# Patient Record
Sex: Female | Born: 2000 | Race: Black or African American | Hispanic: No | Marital: Single
Health system: Southern US, Community
[De-identification: ages and names within clinical notes are randomized; demographics above are authoritative.]

---
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
## 2021-01-11 ENCOUNTER — Ambulatory Visit (INDEPENDENT_AMBULATORY_CARE_PROVIDER_SITE_OTHER): Payer: Medicaid Other

## 2021-01-11 ENCOUNTER — Other Ambulatory Visit: Payer: Self-pay

## 2021-01-11 DIAGNOSIS — Z3042 Encounter for surveillance of injectable contraceptive: Secondary | ICD-10-CM

## 2021-01-11 MED ORDER — MEDROXYPROGESTERONE ACETATE 150 MG/ML IM SUSP
150.0000 mg | Freq: Once | INTRAMUSCULAR | Status: AC
  Administered 2021-01-11: 150 mg via INTRAMUSCULAR

## 2021-01-11 NOTE — Progress Notes (Signed)
Pt presents for depo injection. Pt within depo window, no urine hcg needed. Injection given, tolerated well. F/u depo injection visit scheduled.   

## 2021-03-29 ENCOUNTER — Ambulatory Visit: Payer: Medicaid Other

## 2021-04-11 ENCOUNTER — Ambulatory Visit (INDEPENDENT_AMBULATORY_CARE_PROVIDER_SITE_OTHER): Payer: Medicaid Other

## 2021-04-11 ENCOUNTER — Other Ambulatory Visit: Payer: Self-pay

## 2021-04-11 DIAGNOSIS — Z3042 Encounter for surveillance of injectable contraceptive: Secondary | ICD-10-CM

## 2021-04-11 MED ORDER — MEDROXYPROGESTERONE ACETATE 150 MG/ML IM SUSP
150.0000 mg | Freq: Once | INTRAMUSCULAR | Status: AC
Start: 2021-04-11 — End: 2021-04-11
  Administered 2021-04-11: 150 mg via INTRAMUSCULAR

## 2021-04-11 NOTE — Progress Notes (Signed)
Pt presents for depo injection. Pt within depo window, no urine hcg needed. Injection given, tolerated well. F/u depo injection visit scheduled.   

## 2021-06-27 ENCOUNTER — Ambulatory Visit: Payer: Medicaid Other

## 2021-07-07 ENCOUNTER — Ambulatory Visit (INDEPENDENT_AMBULATORY_CARE_PROVIDER_SITE_OTHER): Payer: Medicaid Other

## 2021-07-07 DIAGNOSIS — Z3042 Encounter for surveillance of injectable contraceptive: Secondary | ICD-10-CM

## 2021-07-07 MED ORDER — MEDROXYPROGESTERONE ACETATE 150 MG/ML IM SUSP
150.0000 mg | Freq: Once | INTRAMUSCULAR | Status: AC
Start: 2021-07-07 — End: 2021-07-07
  Administered 2021-07-07: 150 mg via INTRAMUSCULAR

## 2021-07-07 NOTE — Progress Notes (Addendum)
Pt presents for depo injection. Pt within depo window, no urine hcg needed. Injection given, tolerated well. F/u depo injection visit scheduled.  ? ?Wt Readings from Last 3 Encounters:  ?04/29/19 105 lb 9.6 oz (47.9 kg) (15 %, Z= -1.02)*  ?10/17/18 104 lb 9.6 oz (47.4 kg) (16 %, Z= -0.99)*  ?05/14/13 86 lb 3.2 oz (39.1 kg) (55 %, Z= 0.13)*  ? ?* Growth percentiles are based on CDC (Girls, 2-20 Years) data.  ?  ? ?Needs appt with provider at next Depo. Reviewed and agree with plan of care.  ?

## 2021-07-09 NOTE — Addendum Note (Signed)
Addended by: Georges Mouse on: 07/09/2021 12:58 PM ? ? Modules accepted: Level of Service ? ?

## 2021-08-23 ENCOUNTER — Ambulatory Visit: Payer: Medicaid Other | Admitting: Family Medicine

## 2021-08-23 DIAGNOSIS — Z681 Body mass index (BMI) 19 or less, adult: Secondary | ICD-10-CM | POA: Diagnosis not present

## 2021-08-23 DIAGNOSIS — E559 Vitamin D deficiency, unspecified: Secondary | ICD-10-CM | POA: Diagnosis not present

## 2021-08-23 DIAGNOSIS — D649 Anemia, unspecified: Secondary | ICD-10-CM | POA: Diagnosis not present

## 2021-08-23 DIAGNOSIS — Z1159 Encounter for screening for other viral diseases: Secondary | ICD-10-CM | POA: Diagnosis not present

## 2021-08-23 DIAGNOSIS — Z0000 Encounter for general adult medical examination without abnormal findings: Secondary | ICD-10-CM | POA: Diagnosis not present

## 2021-08-23 DIAGNOSIS — Z0130 Encounter for examination of blood pressure without abnormal findings: Secondary | ICD-10-CM | POA: Diagnosis not present

## 2021-09-22 ENCOUNTER — Ambulatory Visit: Payer: Medicaid Other | Admitting: Pediatrics

## 2021-10-03 ENCOUNTER — Encounter: Payer: Self-pay | Admitting: Family

## 2021-10-03 ENCOUNTER — Ambulatory Visit (INDEPENDENT_AMBULATORY_CARE_PROVIDER_SITE_OTHER): Payer: Medicaid Other | Admitting: Family

## 2021-10-03 VITALS — BP 117/81 | HR 83 | Ht 66.14 in | Wt 123.6 lb

## 2021-10-03 DIAGNOSIS — N921 Excessive and frequent menstruation with irregular cycle: Secondary | ICD-10-CM

## 2021-10-03 DIAGNOSIS — Z113 Encounter for screening for infections with a predominantly sexual mode of transmission: Secondary | ICD-10-CM | POA: Diagnosis not present

## 2021-10-03 DIAGNOSIS — Z3042 Encounter for surveillance of injectable contraceptive: Secondary | ICD-10-CM

## 2021-10-03 MED ORDER — WOMENS MULTIVITAMIN PO TABS: ORAL_TABLET | ORAL | 0 refills | Status: AC

## 2021-10-03 MED ORDER — MEDROXYPROGESTERONE ACETATE 150 MG/ML IM SUSP
150.0000 mg | Freq: Once | INTRAMUSCULAR | Status: AC
  Administered 2021-10-03: 150 mg via INTRAMUSCULAR

## 2021-10-03 MED ORDER — VITAMIN D 50 MCG (2000 UT) PO CAPS: ORAL_CAPSULE | ORAL | 0 refills | Status: AC

## 2021-10-03 NOTE — Progress Notes (Signed)
Pt presents for depo injection. Pt within depo window, no urine hcg needed. Injection given, tolerated well. F/u depo injection visit scheduled.   

## 2021-10-03 NOTE — Progress Notes (Signed)
History was provided by the patient.  Stephanie Montoya is a 21 y.o. female who is here for breakthrough bleeding on Depo.   PCP confirmed? No.  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 (Patient not taking: Reported on 10/03/2021) 17 g 0   Spacer/Aero-Holding Chambers (AEROCHAMBER PLUS WITH MASK) inhaler One spacer 1 each 1   No current facility-administered medications on file prior to visit.    No Known Allergies  Physical Exam:    Vitals:   10/03/21 0843  BP: 117/81  Pulse: 83  Weight: 123 lb 9.6 oz (56.1 kg)  Height: 5' 6.14" (1.68 m)    Blood pressure %iles are not available for patients who are 18 years or older. No LMP recorded.  Physical Exam Constitutional:      General: She is not in acute distress.    Appearance: She is well-developed.  HENT:     Head: Normocephalic and atraumatic.  Eyes:     General: No scleral icterus.    Pupils: Pupils are equal, round, and reactive to light.  Neck:     Thyroid: No thyromegaly.  Cardiovascular:     Rate and Rhythm: Normal rate and regular rhythm.     Heart sounds: Normal heart sounds. No murmur heard. Pulmonary:     Effort: Pulmonary effort is normal.     Breath sounds: Normal breath sounds.  Musculoskeletal:        General: Normal range of motion.     Cervical back: Normal range of motion and neck supple.  Lymphadenopathy:     Cervical: No cervical adenopathy.   Skin:    General: Skin is warm and dry.     Findings: No rash.  Neurological:     Mental Status: She is alert and oriented to person, place, and time.     Cranial Nerves: No cranial nerve deficit.  Psychiatric:        Behavior: Behavior normal.        Thought Content: Thought content normal.        Judgment: Judgment normal.     Assessment/Plan: 1. Menorrhagia with irregular cycle 2. Encounter for Depo-Provera contraception - medroxyPROGESTERone (DEPO-PROVERA) injection 150 mg -due for DXA to assess bone density due to prolonged depo use (> 2 years)  -recommended DMV, vitamin D and calcium supplement  3. Routine screening for STI (sexually transmitted infection) - C. trachomatis/N. gonorrhoeae RNA

## 2021-10-04 LAB — C. TRACHOMATIS/N. GONORRHOEAE RNA
C. trachomatis RNA, TMA: NOT DETECTED
N. gonorrhoeae RNA, TMA: NOT DETECTED

## 2021-11-03 ENCOUNTER — Other Ambulatory Visit: Payer: Self-pay

## 2021-11-03 NOTE — Telephone Encounter (Signed)
AccessNurse Call [phone].   Mom calls requesting a refill for Cholcalciferol.   We do not have a DPR on file to speak with mom. Will use MyChart to communicate with patient.

## 2021-12-01 DIAGNOSIS — Z0130 Encounter for examination of blood pressure without abnormal findings: Secondary | ICD-10-CM | POA: Diagnosis not present

## 2021-12-01 DIAGNOSIS — R404 Transient alteration of awareness: Secondary | ICD-10-CM | POA: Diagnosis not present

## 2021-12-01 DIAGNOSIS — Z681 Body mass index (BMI) 19 or less, adult: Secondary | ICD-10-CM | POA: Diagnosis not present

## 2021-12-01 DIAGNOSIS — E559 Vitamin D deficiency, unspecified: Secondary | ICD-10-CM | POA: Diagnosis not present

## 2021-12-19 ENCOUNTER — Ambulatory Visit: Payer: Medicaid Other | Admitting: Family

## 2022-01-26 ENCOUNTER — Ambulatory Visit: Payer: Medicaid Other | Admitting: Family

## 2022-02-20 ENCOUNTER — Ambulatory Visit (INDEPENDENT_AMBULATORY_CARE_PROVIDER_SITE_OTHER): Payer: Medicaid Other | Admitting: Family

## 2022-02-20 ENCOUNTER — Encounter: Payer: Self-pay | Admitting: Family

## 2022-02-20 VITALS — BP 110/77 | HR 87 | Ht 65.75 in | Wt 124.0 lb

## 2022-02-20 DIAGNOSIS — Z30017 Encounter for initial prescription of implantable subdermal contraceptive: Secondary | ICD-10-CM

## 2022-02-20 DIAGNOSIS — Z113 Encounter for screening for infections with a predominantly sexual mode of transmission: Secondary | ICD-10-CM

## 2022-02-20 DIAGNOSIS — Z3202 Encounter for pregnancy test, result negative: Secondary | ICD-10-CM | POA: Diagnosis not present

## 2022-02-20 DIAGNOSIS — N921 Excessive and frequent menstruation with irregular cycle: Secondary | ICD-10-CM

## 2022-02-20 MED ORDER — ETONOGESTREL 68 MG ~~LOC~~ IMPL
68.0000 mg | DRUG_IMPLANT | Freq: Once | SUBCUTANEOUS | Status: AC
  Administered 2022-02-20: 68 mg via SUBCUTANEOUS

## 2022-02-20 NOTE — Progress Notes (Signed)
History was provided by the patient.  Stephanie Montoya is a 21 y.o. female who is here for nexplanon insertion.   PCP confirmed? No  HPI:   -stopped taking Lexapro 20; was getting more mood swings (Christian Grow)  -LMP: yesterday  -no cramping, no dysuria, no pain with intercourse   Patient Active Problem List   Diagnosis Date Noted   Dysmenorrhea in adolescent 04/23/2019   Menorrhagia with irregular cycle 04/23/2019   Asthma, chronic 05/14/2013    Current Outpatient Medications on File Prior to Visit  Medication Sig Dispense Refill   escitalopram (LEXAPRO) 20 MG tablet Take 20 mg by mouth daily.     albuterol (VENTOLIN HFA) 108 (90 Base) MCG/ACT inhaler Inhale 2 puffs into the lungs every 4 (four) hours as needed for wheezing or shortness of breath. (Patient not taking: Reported on 10/03/2021) 17 g 0   Cholecalciferol (VITAMIN D) 50 MCG (2000 UT) CAPS Take one 50 mcg capsule daily by mouth. (Patient not taking: Reported on 02/20/2022) 90 capsule 0   Multiple Vitamins-Minerals (WOMENS MULTIVITAMIN) TABS Take one tablet by mouth once daily. (Patient not taking: Reported on 02/20/2022) 90 tablet 0   Spacer/Aero-Holding Chambers (AEROCHAMBER PLUS WITH MASK) inhaler One spacer (Patient not taking: Reported on 02/20/2022) 1 each 1   No current facility-administered medications on file prior to visit.    No Known Allergies  Physical Exam:    Vitals:   02/20/22 1027  BP: 110/77  Pulse: 87  Weight: 124 lb (56.2 kg)  Height: 5' 5.75" (1.67 m)    Blood pressure %iles are not available for patients who are 18 years or older. No LMP recorded.  Physical Exam Constitutional:      General: She is not in acute distress.    Appearance: She is well-developed.  HENT:     Head: Normocephalic and atraumatic.  Eyes:     General: No scleral icterus.    Pupils: Pupils are equal, round, and reactive to light.  Neck:     Thyroid: No thyromegaly.  Cardiovascular:     Rate and Rhythm: Normal rate  and regular rhythm.     Heart sounds: Normal heart sounds. No murmur heard. Pulmonary:     Effort: Pulmonary effort is normal.     Breath sounds: Normal breath sounds.  Musculoskeletal:        General: Normal range of motion.     Cervical back: Normal range of motion and neck supple.  Lymphadenopathy:     Cervical: No cervical adenopathy.  Skin:    General: Skin is warm and dry.     Findings: No rash.  Neurological:     Mental Status: She is alert and oriented to person, place, and time.     Cranial Nerves: No cranial nerve deficit.     Motor: No tremor.  Psychiatric:        Behavior: Behavior normal.        Thought Content: Thought content normal.        Judgment: Judgment normal.      Assessment/Plan:  1. Menorrhagia with irregular cycle 2. Encounter for initial prescription of Nexplanon -see procedure note, patient tolerated well; plan for one month follow-up or sooner if needed  -advised to abstain x 7 days before it is effective for birth control  - Subdermal Etonogestrel Implant Insertion - etonogestrel (NEXPLANON) implant 68 mg  3. Routine screening for STI (sexually transmitted infection) - C. trachomatis/N. gonorrhoeae RNA  4. Pregnancy examination or test, negative  result - POCT urine pregnancy

## 2022-02-20 NOTE — Procedures (Signed)
Nexplanon Insertion  No contraindications for placement.  No liver disease, no unexplained vaginal bleeding, no h/o breast cancer, no h/o blood clots.  No LMP recorded.  UHCG: negative   Last Unprotected sex:  NA  Risks & benefits of Nexplanon discussed The nexplanon device was purchased and supplied by [facility]. Packaging instructions supplied to patient Consent form signed  The patient denies any allergies to anesthetics or antiseptics.  Procedure: Pt was placed in supine position. The right arm was flexed at the elbow and externally rotated so that right wrist was parallel to right ear The medial epicondyle of the right arm was identified The insertions site was marked 8 cm proximal to the medial epicondyle The insertion site was cleaned with Betadine The area surrounding the insertion site was covered with a sterile drape 1% lidocaine was injected just under the skin at the insertion site extending 4 cm proximally. The sterile preloaded disposable Nexaplanon applicator was removed from the sterile packaging The applicator needle was inserted at a 30 degree angle at 8 cm proximal to the medial epicondyle as marked The applicator was lowered to a horizontal position and advanced just under the skin for the full length of the needle The slider on the applicator was retracted fully while the applicator remained in the same position, then the applicator was removed. The implant was confirmed via palpation as being in position The implant position was demonstrated to the patient Pressure dressing was applied to the patient.  The patient was instructed to removed the pressure dressing in 24 hrs.  The patient was advised to move slowly from a supine to an upright position  The patient denied any concerns or complaints  The patient was instructed to schedule a follow-up appt in 1 month and to call sooner if any concerns.  The patient acknowledged agreement and understanding of the  plan.

## 2022-02-20 NOTE — Patient Instructions (Signed)
Follow-up with Stephanie Montoya in 1 month. Schedule this appointment before you leave clinic today.  Congratulations on getting your Nexplanon placement!  Below is some important information about Nexplanon.  First remember that Nexplanon does not prevent sexually transmitted infections.  Condoms will help prevent sexually transmitted infections. The Nexplanon starts working 7 days after it was inserted.  There is a risk of getting pregnant if you have unprotected sex in those first 7 days after placement of the Nexplanon.  The Nexplanon lasts for 3 years but can be removed at any time.  You can become pregnant as early as 1 week after removal.  You can have a new Nexplanon put in after the old one is removed if you like.  It is not known whether Nexplanon is as effective in women who are very overweight because the studies did not include many overweight women.  Nexplanon interacts with some medications, including barbiturates, bosentan, carbamazepine, felbamate, griseofulvin, oxcarbazepine, phenytoin, rifampin, St. John's wort, topiramate, HIV medicines.  Please alert your doctor if you are on any of these medicines.  Always tell other healthcare providers that you have a Nexplanon in your arm.  The Nexplanon was placed just under the skin.  Leave the outside bandage on for 24 hours.  Leave the smaller bandage on for 3-5 days or until it falls off on its own.  Keep the area clean and dry for 3-5 days. There is usually bruising or swelling at the insertion site for a few days to a week after placement.  If you see redness or pus draining from the insertion site, call us immediately.  Keep your user card with the date the implant was placed and the date the implant is to be removed.  The most common side effect is a change in your menstrual bleeding pattern.   This bleeding is generally not harmful to you but can be annoying.  Call or come in to see us if you have any concerns about the bleeding or if  you have any side effects or questions.    We will call you in 1 week to check in and we would like you to return to the clinic for a follow-up visit in 1 month.  You can call [facility] 24 hours a day with any questions or concerns.  There is always a nurse or doctor available to take your call.  Call 9-1-1 if you have a life-threatening emergency.  For anything else, please call us at [phone] before heading to the ER. 

## 2022-02-21 LAB — C. TRACHOMATIS/N. GONORRHOEAE RNA
C. trachomatis RNA, TMA: NOT DETECTED
N. gonorrhoeae RNA, TMA: NOT DETECTED

## 2022-03-29 ENCOUNTER — Telehealth: Payer: Medicaid Other | Admitting: Family

## 2022-03-29 ENCOUNTER — Encounter: Payer: Self-pay | Admitting: Family

## 2022-04-06 ENCOUNTER — Telehealth (INDEPENDENT_AMBULATORY_CARE_PROVIDER_SITE_OTHER): Payer: Medicaid Other | Admitting: Family

## 2022-04-06 ENCOUNTER — Encounter: Payer: Self-pay | Admitting: Family

## 2022-04-06 DIAGNOSIS — N921 Excessive and frequent menstruation with irregular cycle: Secondary | ICD-10-CM

## 2022-04-06 DIAGNOSIS — Z975 Presence of (intrauterine) contraceptive device: Secondary | ICD-10-CM | POA: Insufficient documentation

## 2022-04-06 NOTE — Progress Notes (Signed)
THIS RECORD Pinon CONTAIN CONFIDENTIAL INFORMATION THAT SHOULD NOT BE RELEASED WITHOUT REVIEW OF THE SERVICE PROVIDER.  Virtual Follow-Up Visit via Video Note  I connected with Stephanie Montoya  on 04/06/22 at  2:30 PM EST by a video enabled telemedicine application and verified that I am speaking with the correct person using two identifiers.   Patient/parent location: dorm room [employer] Provider location: remote [place]    I discussed the limitations of evaluation and management by telemedicine and the availability of in person appointments.  I discussed that the purpose of this telehealth visit is to provide medical care while limiting exposure to the novel coronavirus.  The patient expressed understanding and agreed to proceed.   Stephanie Montoya is a 22 y.o. female referred by No ref. provider found here today for follow-up of nexplanon insertion.     History was provided by the patient.  Supervising Physician: Dr. Roselind Montoya   Plan from Last Visit:   Nexplanon insertion 02/20/22  Chief Complaint: Nexplanon in place   History of Present Illness:  -no bleeding or cramping  -site well-healed; wanted to know when it would have to be replaced  -mood is good, no concerns  -semester just started - lots of tests already assigned   No Known Allergies Outpatient Medications Prior to Visit  Medication Sig Dispense Refill   albuterol (VENTOLIN HFA) 108 (90 Base) MCG/ACT inhaler Inhale 2 puffs into the lungs every 4 (four) hours as needed for wheezing or shortness of breath. (Patient not taking: Reported on 10/03/2021) 17 g 0   Cholecalciferol (VITAMIN D) 50 MCG (2000 UT) CAPS Take one 50 mcg capsule daily by mouth. (Patient not taking: Reported on 02/20/2022) 90 capsule 0   escitalopram (LEXAPRO) 20 MG tablet Take 20 mg by mouth daily.     Multiple Vitamins-Minerals (WOMENS MULTIVITAMIN) TABS Take one tablet by mouth once daily. (Patient not taking: Reported on 02/20/2022) 90 tablet 0    Spacer/Aero-Holding Chambers (AEROCHAMBER PLUS WITH MASK) inhaler One spacer (Patient not taking: Reported on 02/20/2022) 1 each 1   No facility-administered medications prior to visit.     Patient Active Problem List   Diagnosis Date Noted   Dysmenorrhea in adolescent 04/23/2019   Menorrhagia with irregular cycle 04/23/2019   Asthma, chronic 05/14/2013   The following portions of the patient's history were reviewed and updated as appropriate: allergies, current medications, past family history, past medical history, past social history, past surgical history, and problem list.  Visual Observations/Objective:   General Appearance: Well nourished well developed, in no apparent distress.  Eyes: conjunctiva no swelling or erythema ENT/Mouth: No hoarseness, No cough for duration of visit.  Neck: Supple  Respiratory: Respiratory effort normal, normal rate, no retractions or distress.   Cardio: Appears well-perfused, noncyanotic Musculoskeletal: no obvious deformity Skin: visible skin without rashes, ecchymosis, erythema; well-healed RUE  Neuro: Awake and oriented X 3,  Psych:  normal affect, Insight and Judgment appropriate.    Assessment/Plan: 1. Nexplanon in place 2. Menorrhagia with irregular cycle -reviewed return precautions; advised implant is good for up to 5 years  -return as needed    I discussed the assessment and treatment plan with the patient and/or parent/guardian.  They were provided an opportunity to ask questions and all were answered.  They agreed with the plan and demonstrated an understanding of the instructions. They were advised to call back or seek an in-person evaluation in the emergency room if the symptoms worsen or if the condition fails to improve  as anticipated.   Follow-up:   PRN   Stephanie Ames, NP    CC: No primary care provider on file., No ref. provider found

## 2022-08-15 DIAGNOSIS — Z0000 Encounter for general adult medical examination without abnormal findings: Secondary | ICD-10-CM | POA: Diagnosis not present

## 2022-08-15 DIAGNOSIS — N63 Unspecified lump in unspecified breast: Secondary | ICD-10-CM | POA: Diagnosis not present

## 2022-08-15 DIAGNOSIS — Z0130 Encounter for examination of blood pressure without abnormal findings: Secondary | ICD-10-CM | POA: Diagnosis not present

## 2022-08-15 DIAGNOSIS — F419 Anxiety disorder, unspecified: Secondary | ICD-10-CM | POA: Diagnosis not present

## 2022-08-15 DIAGNOSIS — F32A Depression, unspecified: Secondary | ICD-10-CM | POA: Diagnosis not present

## 2022-08-15 DIAGNOSIS — R42 Dizziness and giddiness: Secondary | ICD-10-CM | POA: Diagnosis not present

## 2022-08-15 DIAGNOSIS — Z3200 Encounter for pregnancy test, result unknown: Secondary | ICD-10-CM | POA: Diagnosis not present

## 2022-08-15 DIAGNOSIS — S90229A Contusion of unspecified lesser toe(s) with damage to nail, initial encounter: Secondary | ICD-10-CM | POA: Diagnosis not present

## 2022-08-15 DIAGNOSIS — Z6820 Body mass index (BMI) 20.0-20.9, adult: Secondary | ICD-10-CM | POA: Diagnosis not present

## 2022-08-29 DIAGNOSIS — Z820 Family history of epilepsy and other diseases of the nervous system: Secondary | ICD-10-CM | POA: Diagnosis not present

## 2022-08-29 DIAGNOSIS — Z789 Other specified health status: Secondary | ICD-10-CM | POA: Diagnosis not present

## 2022-08-29 DIAGNOSIS — Z6821 Body mass index (BMI) 21.0-21.9, adult: Secondary | ICD-10-CM | POA: Diagnosis not present

## 2022-08-29 DIAGNOSIS — E038 Other specified hypothyroidism: Secondary | ICD-10-CM | POA: Diagnosis not present

## 2022-08-29 DIAGNOSIS — Z3200 Encounter for pregnancy test, result unknown: Secondary | ICD-10-CM | POA: Diagnosis not present

## 2022-08-29 DIAGNOSIS — F32A Depression, unspecified: Secondary | ICD-10-CM | POA: Diagnosis not present

## 2022-08-29 DIAGNOSIS — Z0130 Encounter for examination of blood pressure without abnormal findings: Secondary | ICD-10-CM | POA: Diagnosis not present

## 2022-08-29 DIAGNOSIS — F419 Anxiety disorder, unspecified: Secondary | ICD-10-CM | POA: Diagnosis not present

## 2022-08-29 DIAGNOSIS — R441 Visual hallucinations: Secondary | ICD-10-CM | POA: Diagnosis not present

## 2022-09-13 ENCOUNTER — Telehealth: Payer: Medicaid Other | Admitting: Physician Assistant

## 2022-09-13 DIAGNOSIS — M958 Other specified acquired deformities of musculoskeletal system: Secondary | ICD-10-CM

## 2022-09-13 DIAGNOSIS — R1030 Lower abdominal pain, unspecified: Secondary | ICD-10-CM

## 2022-09-13 NOTE — Patient Instructions (Signed)
Stephanie Montoya, thank you for joining Margaretann Loveless, PA-C for today's virtual visit.  While this provider is not your primary care provider (PCP), if your PCP is located in our provider database this encounter information will be shared with them immediately following your visit.   A [facility] MyChart account gives you access to today's visit and all your visits, tests, and labs performed at [facility] " click here if you don't have a [facility] MyChart account or go to [email or web address]  Consent: (Patient) Stephanie Montoya provided verbal consent for this virtual visit at the beginning of the encounter.  Current Medications:  Current Outpatient Medications:    albuterol (VENTOLIN HFA) 108 (90 Base) MCG/ACT inhaler, Inhale 2 puffs into the lungs every 4 (four) hours as needed for wheezing or shortness of breath. (Patient not taking: Reported on 10/03/2021), Disp: 17 g, Rfl: 0   Cholecalciferol (VITAMIN D) 50 MCG (2000 UT) CAPS, Take one 50 mcg capsule daily by mouth. (Patient not taking: Reported on 02/20/2022), Disp: 90 capsule, Rfl: 0   escitalopram (LEXAPRO) 20 MG tablet, Take 20 mg by mouth daily., Disp: , Rfl:    Multiple Vitamins-Minerals (WOMENS MULTIVITAMIN) TABS, Take one tablet by mouth once daily. (Patient not taking: Reported on 02/20/2022), Disp: 90 tablet, Rfl: 0   Spacer/Aero-Holding Chambers (AEROCHAMBER PLUS WITH MASK) inhaler, One spacer (Patient not taking: Reported on 02/20/2022), Disp: 1 each, Rfl: 1   Medications ordered in this encounter:  No orders of the defined types were placed in this encounter.    *If you need refills on other medications prior to your next appointment, please contact your pharmacy*  Follow-Up: Call back or seek an in-person evaluation if the symptoms worsen or if the condition fails to improve as anticipated.  [facility] [phone]  Other Instructions Hernia, Adult     A hernia happens when an organ or  tissue inside your body pushes out through a weak spot in the muscles of your belly (abdomen). This makes a bulge. The bulge Bossard be: In a scar from a surgery that was done in your belly (incisional hernia). Near your belly button (umbilical hernia). In your groin (inguinal hernia). Your groin is the area where your leg meets your lower belly. If you are a female, this type could also be in your scrotum. In your upper thigh (femoral hernia). Inside your belly (hiatal hernia). This happens when your stomach slides above the muscle between your belly and your chest (diaphragm). What are the causes? This condition Sasaki be caused by: Lifting heavy things. Coughing over a long period of time. Having trouble pooping (constipation). Trouble pooping can lead to straining. A cut from surgery in your belly. A physical problem that is present at birth. Being very overweight. Smoking. Too much fluid in your belly. A testicle that has not moved down into the scrotum, in males. What are the signs or symptoms? The main symptom is a bulge in the area of the hernia, but a bulge Cephas not always be seen. It Slaymaker grow bigger or be easier to see when you cough or strain (such as when lifting something heavy). A hernia that can be pushed back into the belly rarely causes pain. A hernia that cannot be pushed back into the belly Behrens lose its blood supply. This Goosby cause: Pain. Fever. A feeling like you Quayle vomit, and vomiting. Swelling. Trouble pooping. How is this treated? A hernia that is small and painless Henken not  need to be treated. A hernia that is large or painful Tavares be treated with surgery. Surgery to treat a hernia involves pushing the bulge back into place and repairing the weak area of the muscle or belly. Follow these instructions at home: Activity Avoid straining the muscles near your hernia. This can happen when you: Lift something heavy. Poop (have a bowel movement). Do not lift anything that is  heavier than 10 lb (4.5 kg), or the limit that you are told. When you lift something heavy, use your leg muscles. Do not use your back muscles to lift. Prevent trouble pooping If told by your doctor, take steps to prevent trouble pooping. You Brindley need to: Drink enough fluid to keep your pee (urine) pale yellow. Take medicines. You will be told what medicines to take. Eat foods that are high in fiber. These include beans, whole grains, and fresh fruits and vegetables. Limit foods that are high in fat and sugar. These include fried or sweet foods. General instructions When you cough, try to cough gently. You Aslin try to push your hernia back in by gently pressing on it when you are lying down. Do not try to force the bulge back in if it will not go in easily. If you are overweight, work with your doctor to lose weight safely. Do not smoke or use any products that contain nicotine or tobacco. If you need help quitting, ask your doctor. If you will be having surgery, watch your hernia for changes in shape, size, or color. Tell your doctor if you see any changes. Take over-the-counter and prescription medicines only as told by your doctor. Keep all follow-up visits. Contact a doctor if: You get new pain, swelling, or redness near your hernia. You poop fewer times in a week than normal. You have trouble pooping. You have poop that is more dry than normal. You have poop that is harder or larger than normal. Get help right away if: You have a fever or chills. You have belly pain that gets worse. You feel like you Cleckler vomit, or you vomit. Your hernia cannot be pushed in by gently pressing on it when you are lying down. Your hernia: Changes in shape or size. Changes color. Feels hard, or it hurts when you touch it. These symptoms Guggisberg be an emergency. Get help right away. Call your local emergency services (911 in the [place] Do not wait to see if the symptoms will go away. Do not drive yourself  to the hospital. Summary A hernia happens when an organ or tissue inside your body pushes out through a weak spot in the belly muscles. This creates a bulge. If your hernia is small and it does not hurt, you Gaeta not need treatment. If your hernia is large or it hurts, you Amenta need surgery. If you will be having surgery, watch your hernia for changes in shape, size, or color. Tell your doctor about any changes. This information is not intended to replace advice given to you by your health care provider. Make sure you discuss any questions you have with your health care provider. Document Revised: 10/13/2019 Document Reviewed: 10/13/2019 Elsevier Patient Education  2024 Elsevier Inc.    If you have been instructed to have an in-person evaluation today at a local Urgent Care facility, please use the link below. It will take you to a list of all of our available [facility], including address, phone number and hours of operation. Please do not  delay care.  [facility]  If you or a family member do not have a primary care provider, use the link below to schedule a visit and establish care. When you choose a [facility] primary care physician or advanced practice provider, you gain a long-term partner in health. Find a Primary Care Provider  Learn more about [place] in-office and virtual care options: [facility]

## 2022-09-13 NOTE — Progress Notes (Signed)
Virtual Visit Consent   Stephanie Montoya, you are scheduled for a virtual visit with a [facility] provider today. Just as with appointments in the office, your consent must be obtained to participate. Your consent will be active for this visit and any virtual visit you [facility] have with one of our providers in the next 365 days. If you have a MyChart account, a copy of this consent can be sent to you electronically.  As this is a virtual visit, video technology does not allow for your provider to perform a traditional examination. This [place] limit your provider's ability to fully assess your condition. If your provider identifies any concerns that need to be evaluated in person or the need to arrange testing (such as labs, EKG, etc.), we will make arrangements to do so. Although advances in technology are sophisticated, we cannot ensure that it will always work on either your end or our end. If the connection with a video visit is poor, the visit Xiong have to be switched to a telephone visit. With either a video or telephone visit, we are not always able to ensure that we have a secure connection.  By engaging in this virtual visit, you consent to the provision of healthcare and authorize for your insurance to be billed (if applicable) for the services provided during this visit. Depending on your insurance coverage, you [place] receive a charge related to this service.  I need to obtain your verbal consent now. Are you willing to proceed with your visit today? Stephanie Montoya has provided verbal consent on 09/13/2022 for a virtual visit (video or telephone). Stephanie Loveless, PA-C  Date: 09/13/2022 1:45 PM  Virtual Visit via Video Note   IMargaretann Montoya, connected with  Stephanie Montoya  ([id number], 2000-08-27) on 09/13/22 at 12:45 PM EDT by a video-enabled telemedicine application and verified that I am speaking with the correct person using two identifiers.  Location: Patient: Virtual Visit Location Patient:  Home Provider: Virtual Visit Location Provider: Home Office   I discussed the limitations of evaluation and management by telemedicine and the availability of in person appointments. The patient expressed understanding and agreed to proceed.    History of Present Illness: Stephanie Montoya is a 22 y.o. who identifies as a female who was assigned female at birth, and is being seen today for pain in lower abdomen that started on Monday. Had pain over suprapubic area, reported to be just distal to the umbilicus. She reports she has a "bump" that has been present for a long time, she can remember as early as 22 years old. It has never given her problems. She feels it best when she is laying back on her back. It has never caused pain and she could feel it get bigger and then would "go away" if she rested her hand on it. Since Monday, she has had more pain over the area and it does not go down. She denies any redness or warmth overlying the area. Denies urinary changes, bowel changes, nausea, vomiting, fevers, chills. She does have a job that requires heavier lifting. She did just finish her menstrual cycle. Has Nexplanon and reports menstrual cycles have been irregular since placement. This cycle that just ended lasted about a month.   Problems:  Patient Active Problem List   Diagnosis Date Noted   Nexplanon in place 04/06/2022   Dysmenorrhea in adolescent 04/23/2019   Menorrhagia with irregular cycle 04/23/2019   Asthma, chronic 05/14/2013    Allergies:  No Known Allergies Medications:  Current Outpatient Medications:    albuterol (VENTOLIN HFA) 108 (90 Base) MCG/ACT inhaler, Inhale 2 puffs into the lungs every 4 (four) hours as needed for wheezing or shortness of breath. (Patient not taking: Reported on 10/03/2021), Disp: 17 g, Rfl: 0   Cholecalciferol (VITAMIN D) 50 MCG (2000 UT) CAPS, Take one 50 mcg capsule daily by mouth. (Patient not taking: Reported on 02/20/2022), Disp: 90 capsule, Rfl: 0    escitalopram (LEXAPRO) 20 MG tablet, Take 20 mg by mouth daily., Disp: , Rfl:    Multiple Vitamins-Minerals (WOMENS MULTIVITAMIN) TABS, Take one tablet by mouth once daily. (Patient not taking: Reported on 02/20/2022), Disp: 90 tablet, Rfl: 0   Spacer/Aero-Holding Chambers (AEROCHAMBER PLUS WITH MASK) inhaler, One spacer (Patient not taking: Reported on 02/20/2022), Disp: 1 each, Rfl: 1  Observations/Objective: Patient is well-developed, well-nourished in no acute distress.  Resting comfortably at home.  Head is normocephalic, atraumatic.  No labored breathing.  Speech is clear and coherent with logical content.  Patient is alert and oriented at baseline.    Assessment and Plan: 1. Abdominal wall defect, acquired  2. Lower abdominal pain  - Possible hernia that does not reduce any longer with pain - Advised to seek in person evaluation at a local Urgent Care for better evaluation  Follow Up Instructions: I discussed the assessment and treatment plan with the patient. The patient was provided an opportunity to ask questions and all were answered. The patient agreed with the plan and demonstrated an understanding of the instructions.  A copy of instructions were sent to the patient via MyChart unless otherwise noted below.    The patient was advised to call back or seek an in-person evaluation if the symptoms worsen or if the condition fails to improve as anticipated.  Time:  I spent 15 minutes with the patient via telehealth technology discussing the above problems/concerns.    Stephanie Loveless, PA-C

## 2022-09-25 DIAGNOSIS — R1084 Generalized abdominal pain: Secondary | ICD-10-CM | POA: Diagnosis not present

## 2022-12-13 DIAGNOSIS — F411 Generalized anxiety disorder: Secondary | ICD-10-CM | POA: Diagnosis not present

## 2022-12-13 DIAGNOSIS — F4312 Post-traumatic stress disorder, chronic: Secondary | ICD-10-CM | POA: Diagnosis not present

## 2022-12-13 DIAGNOSIS — F331 Major depressive disorder, recurrent, moderate: Secondary | ICD-10-CM | POA: Diagnosis not present

## 2022-12-15 DIAGNOSIS — F419 Anxiety disorder, unspecified: Secondary | ICD-10-CM | POA: Diagnosis not present

## 2022-12-15 DIAGNOSIS — Z789 Other specified health status: Secondary | ICD-10-CM | POA: Diagnosis not present

## 2022-12-15 DIAGNOSIS — Z3200 Encounter for pregnancy test, result unknown: Secondary | ICD-10-CM | POA: Diagnosis not present

## 2022-12-15 DIAGNOSIS — F32A Depression, unspecified: Secondary | ICD-10-CM | POA: Diagnosis not present

## 2022-12-15 DIAGNOSIS — Z6821 Body mass index (BMI) 21.0-21.9, adult: Secondary | ICD-10-CM | POA: Diagnosis not present

## 2022-12-15 DIAGNOSIS — E038 Other specified hypothyroidism: Secondary | ICD-10-CM | POA: Diagnosis not present

## 2022-12-15 DIAGNOSIS — F819 Developmental disorder of scholastic skills, unspecified: Secondary | ICD-10-CM | POA: Diagnosis not present

## 2022-12-15 DIAGNOSIS — Z0130 Encounter for examination of blood pressure without abnormal findings: Secondary | ICD-10-CM | POA: Diagnosis not present

## 2022-12-16 DIAGNOSIS — E038 Other specified hypothyroidism: Secondary | ICD-10-CM | POA: Diagnosis not present

## 2022-12-27 DIAGNOSIS — F419 Anxiety disorder, unspecified: Secondary | ICD-10-CM | POA: Diagnosis not present

## 2022-12-27 DIAGNOSIS — R441 Visual hallucinations: Secondary | ICD-10-CM | POA: Diagnosis not present

## 2022-12-27 DIAGNOSIS — F40218 Other animal type phobia: Secondary | ICD-10-CM | POA: Diagnosis not present

## 2022-12-29 DIAGNOSIS — F819 Developmental disorder of scholastic skills, unspecified: Secondary | ICD-10-CM | POA: Diagnosis not present

## 2022-12-29 DIAGNOSIS — E038 Other specified hypothyroidism: Secondary | ICD-10-CM | POA: Diagnosis not present

## 2022-12-29 DIAGNOSIS — L608 Other nail disorders: Secondary | ICD-10-CM | POA: Diagnosis not present

## 2022-12-29 DIAGNOSIS — Z6821 Body mass index (BMI) 21.0-21.9, adult: Secondary | ICD-10-CM | POA: Diagnosis not present

## 2022-12-29 DIAGNOSIS — Z3200 Encounter for pregnancy test, result unknown: Secondary | ICD-10-CM | POA: Diagnosis not present

## 2022-12-29 DIAGNOSIS — Z789 Other specified health status: Secondary | ICD-10-CM | POA: Diagnosis not present

## 2022-12-29 DIAGNOSIS — F419 Anxiety disorder, unspecified: Secondary | ICD-10-CM | POA: Diagnosis not present

## 2022-12-29 DIAGNOSIS — Z0130 Encounter for examination of blood pressure without abnormal findings: Secondary | ICD-10-CM | POA: Diagnosis not present

## 2022-12-29 DIAGNOSIS — F32A Depression, unspecified: Secondary | ICD-10-CM | POA: Diagnosis not present

## 2023-01-01 DIAGNOSIS — F331 Major depressive disorder, recurrent, moderate: Secondary | ICD-10-CM | POA: Diagnosis not present

## 2023-01-01 DIAGNOSIS — F4312 Post-traumatic stress disorder, chronic: Secondary | ICD-10-CM | POA: Diagnosis not present

## 2023-01-01 DIAGNOSIS — F411 Generalized anxiety disorder: Secondary | ICD-10-CM | POA: Diagnosis not present

## 2023-01-22 DIAGNOSIS — F411 Generalized anxiety disorder: Secondary | ICD-10-CM | POA: Diagnosis not present

## 2023-01-22 DIAGNOSIS — F331 Major depressive disorder, recurrent, moderate: Secondary | ICD-10-CM | POA: Diagnosis not present

## 2023-02-07 DIAGNOSIS — F419 Anxiety disorder, unspecified: Secondary | ICD-10-CM | POA: Diagnosis not present

## 2023-02-07 DIAGNOSIS — F40218 Other animal type phobia: Secondary | ICD-10-CM | POA: Diagnosis not present

## 2023-03-01 DIAGNOSIS — F411 Generalized anxiety disorder: Secondary | ICD-10-CM | POA: Diagnosis not present

## 2023-03-01 DIAGNOSIS — F4312 Post-traumatic stress disorder, chronic: Secondary | ICD-10-CM | POA: Diagnosis not present

## 2023-03-01 DIAGNOSIS — F331 Major depressive disorder, recurrent, moderate: Secondary | ICD-10-CM | POA: Diagnosis not present

## 2023-03-22 DIAGNOSIS — F4312 Post-traumatic stress disorder, chronic: Secondary | ICD-10-CM | POA: Diagnosis not present

## 2023-03-22 DIAGNOSIS — F411 Generalized anxiety disorder: Secondary | ICD-10-CM | POA: Diagnosis not present

## 2023-03-22 DIAGNOSIS — F331 Major depressive disorder, recurrent, moderate: Secondary | ICD-10-CM | POA: Diagnosis not present

## 2023-04-05 DIAGNOSIS — F331 Major depressive disorder, recurrent, moderate: Secondary | ICD-10-CM | POA: Diagnosis not present

## 2023-04-05 DIAGNOSIS — F4312 Post-traumatic stress disorder, chronic: Secondary | ICD-10-CM | POA: Diagnosis not present

## 2023-04-05 DIAGNOSIS — F411 Generalized anxiety disorder: Secondary | ICD-10-CM | POA: Diagnosis not present

## 2023-04-19 DIAGNOSIS — F331 Major depressive disorder, recurrent, moderate: Secondary | ICD-10-CM | POA: Diagnosis not present

## 2023-04-19 DIAGNOSIS — F411 Generalized anxiety disorder: Secondary | ICD-10-CM | POA: Diagnosis not present

## 2023-04-19 DIAGNOSIS — F4312 Post-traumatic stress disorder, chronic: Secondary | ICD-10-CM | POA: Diagnosis not present

## 2023-05-03 DIAGNOSIS — F331 Major depressive disorder, recurrent, moderate: Secondary | ICD-10-CM | POA: Diagnosis not present

## 2023-05-03 DIAGNOSIS — F411 Generalized anxiety disorder: Secondary | ICD-10-CM | POA: Diagnosis not present

## 2023-05-03 DIAGNOSIS — F4312 Post-traumatic stress disorder, chronic: Secondary | ICD-10-CM | POA: Diagnosis not present

## 2023-05-17 DIAGNOSIS — F411 Generalized anxiety disorder: Secondary | ICD-10-CM | POA: Diagnosis not present

## 2023-05-17 DIAGNOSIS — F331 Major depressive disorder, recurrent, moderate: Secondary | ICD-10-CM | POA: Diagnosis not present

## 2023-05-17 DIAGNOSIS — F4312 Post-traumatic stress disorder, chronic: Secondary | ICD-10-CM | POA: Diagnosis not present

## 2023-05-31 DIAGNOSIS — F331 Major depressive disorder, recurrent, moderate: Secondary | ICD-10-CM | POA: Diagnosis not present

## 2023-05-31 DIAGNOSIS — F411 Generalized anxiety disorder: Secondary | ICD-10-CM | POA: Diagnosis not present

## 2023-05-31 DIAGNOSIS — F4312 Post-traumatic stress disorder, chronic: Secondary | ICD-10-CM | POA: Diagnosis not present

## 2023-06-14 DIAGNOSIS — F4312 Post-traumatic stress disorder, chronic: Secondary | ICD-10-CM | POA: Diagnosis not present

## 2023-06-14 DIAGNOSIS — F411 Generalized anxiety disorder: Secondary | ICD-10-CM | POA: Diagnosis not present

## 2023-06-14 DIAGNOSIS — F331 Major depressive disorder, recurrent, moderate: Secondary | ICD-10-CM | POA: Diagnosis not present

## 2023-06-21 DIAGNOSIS — F331 Major depressive disorder, recurrent, moderate: Secondary | ICD-10-CM | POA: Diagnosis not present

## 2023-06-21 DIAGNOSIS — F411 Generalized anxiety disorder: Secondary | ICD-10-CM | POA: Diagnosis not present

## 2023-06-21 DIAGNOSIS — F4312 Post-traumatic stress disorder, chronic: Secondary | ICD-10-CM | POA: Diagnosis not present

## 2023-07-01 DIAGNOSIS — Z3200 Encounter for pregnancy test, result unknown: Secondary | ICD-10-CM | POA: Diagnosis not present

## 2023-07-01 DIAGNOSIS — Z789 Other specified health status: Secondary | ICD-10-CM | POA: Diagnosis not present

## 2023-07-01 DIAGNOSIS — Z6821 Body mass index (BMI) 21.0-21.9, adult: Secondary | ICD-10-CM | POA: Diagnosis not present

## 2023-07-01 DIAGNOSIS — Z0000 Encounter for general adult medical examination without abnormal findings: Secondary | ICD-10-CM | POA: Diagnosis not present

## 2023-07-03 DIAGNOSIS — F411 Generalized anxiety disorder: Secondary | ICD-10-CM | POA: Diagnosis not present

## 2023-07-03 DIAGNOSIS — F331 Major depressive disorder, recurrent, moderate: Secondary | ICD-10-CM | POA: Diagnosis not present

## 2023-07-03 DIAGNOSIS — F4312 Post-traumatic stress disorder, chronic: Secondary | ICD-10-CM | POA: Diagnosis not present

## 2023-07-15 DIAGNOSIS — Z124 Encounter for screening for malignant neoplasm of cervix: Secondary | ICD-10-CM | POA: Diagnosis not present

## 2023-07-15 DIAGNOSIS — Z0189 Encounter for other specified special examinations: Secondary | ICD-10-CM | POA: Diagnosis not present

## 2023-07-15 DIAGNOSIS — Z789 Other specified health status: Secondary | ICD-10-CM | POA: Diagnosis not present

## 2023-07-15 DIAGNOSIS — E559 Vitamin D deficiency, unspecified: Secondary | ICD-10-CM | POA: Diagnosis not present

## 2023-07-20 DIAGNOSIS — F4312 Post-traumatic stress disorder, chronic: Secondary | ICD-10-CM | POA: Diagnosis not present

## 2023-07-20 DIAGNOSIS — F331 Major depressive disorder, recurrent, moderate: Secondary | ICD-10-CM | POA: Diagnosis not present

## 2023-07-20 DIAGNOSIS — F411 Generalized anxiety disorder: Secondary | ICD-10-CM | POA: Diagnosis not present

## 2023-08-17 DIAGNOSIS — F331 Major depressive disorder, recurrent, moderate: Secondary | ICD-10-CM | POA: Diagnosis not present

## 2023-08-17 DIAGNOSIS — F4312 Post-traumatic stress disorder, chronic: Secondary | ICD-10-CM | POA: Diagnosis not present

## 2023-08-17 DIAGNOSIS — F411 Generalized anxiety disorder: Secondary | ICD-10-CM | POA: Diagnosis not present

## 2023-08-30 DIAGNOSIS — F331 Major depressive disorder, recurrent, moderate: Secondary | ICD-10-CM | POA: Diagnosis not present

## 2023-08-30 DIAGNOSIS — F4312 Post-traumatic stress disorder, chronic: Secondary | ICD-10-CM | POA: Diagnosis not present

## 2023-08-30 DIAGNOSIS — F411 Generalized anxiety disorder: Secondary | ICD-10-CM | POA: Diagnosis not present

## 2023-09-14 DIAGNOSIS — F411 Generalized anxiety disorder: Secondary | ICD-10-CM | POA: Diagnosis not present

## 2023-09-14 DIAGNOSIS — F4312 Post-traumatic stress disorder, chronic: Secondary | ICD-10-CM | POA: Diagnosis not present

## 2023-09-14 DIAGNOSIS — F331 Major depressive disorder, recurrent, moderate: Secondary | ICD-10-CM | POA: Diagnosis not present

## 2023-10-05 DIAGNOSIS — F331 Major depressive disorder, recurrent, moderate: Secondary | ICD-10-CM | POA: Diagnosis not present

## 2023-10-05 DIAGNOSIS — F411 Generalized anxiety disorder: Secondary | ICD-10-CM | POA: Diagnosis not present

## 2023-10-05 DIAGNOSIS — F4312 Post-traumatic stress disorder, chronic: Secondary | ICD-10-CM | POA: Diagnosis not present

## 2023-10-18 DIAGNOSIS — F331 Major depressive disorder, recurrent, moderate: Secondary | ICD-10-CM | POA: Diagnosis not present

## 2023-10-18 DIAGNOSIS — F4312 Post-traumatic stress disorder, chronic: Secondary | ICD-10-CM | POA: Diagnosis not present

## 2023-10-18 DIAGNOSIS — F411 Generalized anxiety disorder: Secondary | ICD-10-CM | POA: Diagnosis not present

## 2023-11-05 DIAGNOSIS — F331 Major depressive disorder, recurrent, moderate: Secondary | ICD-10-CM | POA: Diagnosis not present

## 2023-11-05 DIAGNOSIS — F4312 Post-traumatic stress disorder, chronic: Secondary | ICD-10-CM | POA: Diagnosis not present

## 2023-11-05 DIAGNOSIS — F411 Generalized anxiety disorder: Secondary | ICD-10-CM | POA: Diagnosis not present

## 2023-11-22 DIAGNOSIS — F4312 Post-traumatic stress disorder, chronic: Secondary | ICD-10-CM | POA: Diagnosis not present

## 2023-11-22 DIAGNOSIS — F411 Generalized anxiety disorder: Secondary | ICD-10-CM | POA: Diagnosis not present

## 2023-11-22 DIAGNOSIS — F331 Major depressive disorder, recurrent, moderate: Secondary | ICD-10-CM | POA: Diagnosis not present

## 2024-01-03 DIAGNOSIS — F411 Generalized anxiety disorder: Secondary | ICD-10-CM | POA: Diagnosis not present

## 2024-01-03 DIAGNOSIS — F331 Major depressive disorder, recurrent, moderate: Secondary | ICD-10-CM | POA: Diagnosis not present

## 2024-01-03 DIAGNOSIS — F4312 Post-traumatic stress disorder, chronic: Secondary | ICD-10-CM | POA: Diagnosis not present

## 2024-01-17 DIAGNOSIS — F411 Generalized anxiety disorder: Secondary | ICD-10-CM | POA: Diagnosis not present

## 2024-01-17 DIAGNOSIS — F331 Major depressive disorder, recurrent, moderate: Secondary | ICD-10-CM | POA: Diagnosis not present

## 2024-01-31 DIAGNOSIS — F331 Major depressive disorder, recurrent, moderate: Secondary | ICD-10-CM | POA: Diagnosis not present

## 2024-01-31 DIAGNOSIS — F4312 Post-traumatic stress disorder, chronic: Secondary | ICD-10-CM | POA: Diagnosis not present

## 2024-01-31 DIAGNOSIS — F411 Generalized anxiety disorder: Secondary | ICD-10-CM | POA: Diagnosis not present
